# Patient Record
Sex: Female | Born: 1980 | Hispanic: No | Marital: Married | Smoking: Never smoker
Health system: Southern US, Community
[De-identification: ages and names within clinical notes are randomized; demographics above are authoritative.]

## PROBLEM LIST (undated history)

## (undated) ENCOUNTER — Emergency Department (HOSPITAL_COMMUNITY): Admission: EM | Payer: Self-pay | Source: Home / Self Care

## (undated) DIAGNOSIS — N809 Endometriosis, unspecified: Secondary | ICD-10-CM

## (undated) HISTORY — PX: CHOLECYSTECTOMY: SHX55

---

## 1999-10-31 ENCOUNTER — Emergency Department (HOSPITAL_COMMUNITY): Admission: EM | Admit: 1999-10-31 | Discharge: 1999-10-31 | Payer: Self-pay | Admitting: Emergency Medicine

## 1999-11-01 ENCOUNTER — Ambulatory Visit (HOSPITAL_COMMUNITY): Admission: RE | Admit: 1999-11-01 | Discharge: 1999-11-01 | Payer: Self-pay | Admitting: Emergency Medicine

## 1999-11-01 ENCOUNTER — Encounter: Payer: Self-pay | Admitting: Emergency Medicine

## 2016-01-05 ENCOUNTER — Emergency Department (HOSPITAL_COMMUNITY): Payer: Medicaid Other

## 2016-01-05 ENCOUNTER — Encounter (HOSPITAL_COMMUNITY): Payer: Self-pay | Admitting: *Deleted

## 2016-01-05 ENCOUNTER — Emergency Department (HOSPITAL_COMMUNITY)
Admission: EM | Admit: 2016-01-05 | Discharge: 2016-01-06 | Disposition: A | Discharge status: Other Acute Inpatient Hospital | Payer: Medicaid Other | Attending: Emergency Medicine | Admitting: Emergency Medicine

## 2016-01-05 DIAGNOSIS — R1010 Upper abdominal pain, unspecified: Secondary | ICD-10-CM | POA: Diagnosis present

## 2016-01-05 LAB — CBC
HCT: 40.2 % (ref 36.0–46.0)
Hemoglobin: 13.6 g/dL (ref 12.0–15.0)
MCH: 29.8 pg (ref 26.0–34.0)
MCHC: 33.8 g/dL (ref 30.0–36.0)
MCV: 88.2 fL (ref 78.0–100.0)
PLATELETS: 315 10*3/uL (ref 150–400)
RBC: 4.56 MIL/uL (ref 3.87–5.11)
RDW: 11.7 % (ref 11.5–15.5)
WBC: 13.6 10*3/uL — AB (ref 4.0–10.5)

## 2016-01-05 LAB — URINALYSIS, ROUTINE W REFLEX MICROSCOPIC
Glucose, UA: NEGATIVE mg/dL
NITRITE: NEGATIVE
PROTEIN: 30 mg/dL — AB
Specific Gravity, Urine: >1.046 — ABNORMAL HIGH (ref 1.005–1.030)
pH: 6 (ref 5.0–8.0)

## 2016-01-05 LAB — COMPREHENSIVE METABOLIC PANEL
ALT: 53 U/L (ref 14–54)
AST: 61 U/L — AB (ref 15–41)
Albumin: 3.8 g/dL (ref 3.5–5.0)
Alkaline Phosphatase: 90 U/L (ref 38–126)
Anion gap: 10 (ref 5–15)
BILIRUBIN TOTAL: 0.4 mg/dL (ref 0.3–1.2)
BUN: 5 mg/dL — AB (ref 6–20)
CO2: 23 mmol/L (ref 22–32)
CREATININE: 0.71 mg/dL (ref 0.44–1.00)
Calcium: 9 mg/dL (ref 8.9–10.3)
Chloride: 101 mmol/L (ref 101–111)
Glucose, Bld: 154 mg/dL — ABNORMAL HIGH (ref 65–99)
Potassium: 4 mmol/L (ref 3.5–5.1)
Sodium: 134 mmol/L — ABNORMAL LOW (ref 135–145)
TOTAL PROTEIN: 7.2 g/dL (ref 6.5–8.1)

## 2016-01-05 LAB — LIPASE, BLOOD: Lipase: 13 U/L (ref 11–51)

## 2016-01-05 LAB — URINE MICROSCOPIC-ADD ON

## 2016-01-05 LAB — PREGNANCY, URINE: Preg Test, Ur: NEGATIVE

## 2016-01-05 MED ORDER — HYDROMORPHONE HCL 1 MG/ML IJ SOLN
0.5000 mg | INTRAMUSCULAR | Status: DC | PRN
  Administered 2016-01-05 – 2016-01-06 (×2): 0.5 mg via INTRAVENOUS
  Filled 2016-01-05 (×3): qty 1

## 2016-01-05 MED ORDER — ONDANSETRON 4 MG PO TBDP
4.0000 mg | ORAL_TABLET | Freq: Once | ORAL | Status: AC | PRN
  Administered 2016-01-05: 4 mg via ORAL

## 2016-01-05 MED ORDER — ONDANSETRON HCL 4 MG/2ML IJ SOLN
4.0000 mg | Freq: Once | INTRAMUSCULAR | Status: AC
  Administered 2016-01-05: 4 mg via INTRAVENOUS
  Filled 2016-01-05: qty 2

## 2016-01-05 MED ORDER — ONDANSETRON 4 MG PO TBDP
ORAL_TABLET | ORAL | Status: DC
Start: 2016-01-05 — End: 2016-01-06
  Filled 2016-01-05: qty 1

## 2016-01-05 MED ORDER — OXYCODONE-ACETAMINOPHEN 5-325 MG PO TABS
2.0000 | ORAL_TABLET | Freq: Once | ORAL | Status: AC
  Administered 2016-01-05: 2 via ORAL
  Filled 2016-01-05: qty 2

## 2016-01-05 MED ORDER — SODIUM CHLORIDE 0.9 % IV BOLUS (SEPSIS)
1000.0000 mL | Freq: Once | INTRAVENOUS | Status: AC
Start: 2016-01-05 — End: 2016-01-05
  Administered 2016-01-05: 1000 mL via INTRAVENOUS

## 2016-01-05 MED ORDER — HYDROMORPHONE HCL 1 MG/ML IJ SOLN
1.0000 mg | Freq: Once | INTRAMUSCULAR | Status: AC
  Administered 2016-01-05: 1 mg via INTRAVENOUS
  Filled 2016-01-05: qty 1

## 2016-01-05 NOTE — ED Provider Notes (Signed)
CSN: [id number]     Arrival date & time 01/05/16  1845 History   First MD Initiated Contact with Patient 01/05/16 1915     Chief Complaint  Patient presents with  . Abdominal Pain    (Consider location/radiation/quality/duration/timing/severity/associated sxs/prior Treatment) HPI Comments: Patient with history of [facility]olecyst[facility] by Dr. Selena BattenKim. Operation was complicated by adhesions. This morning patient developed significant upper abdominal pain with some vomiting. Patient was [facility] where she had a normal white blood cell count and a CT chest, abdomen, and pelvis showing a small amount of fluid in the gallbladder fossa as well as a small amount of free fluid about the liver. Patient was treated for constipation with enema 2. She was discharged to home with MiraLAX and pain medication. Patient's symptoms continue to be severe so family brought her to the emergency department to be seen. Patient describes severe upper abdominal pain with radiation to the bilateral shoulders. No urinary symptoms or diarrhea. Patient denies any fevers. The onset of this condition was acute. The course is constant. Aggravating factors: none. Alleviating factors: none.    The history is provided by the patient.    History reviewed. No pertinent past medical history. Past Surgical History  Procedure Laterality Date  . Cholecystectomy     History reviewed. No pertinent family history. Social History  Substance Use Topics  . Smoking status: Never Smoker   . Smokeless tobacco: Never Used  . Alcohol Use: No   OB History    No data available     Review of Systems  Constitutional: Negative for fever.  HENT: Negative for rhinorrhea and sore throat.   Eyes: Negative for redness.  Respiratory: Negative for cough.   Cardiovascular: Negative for chest pain.  Gastrointestinal: Positive for nausea, vomiting and abdominal pain. Negative for diarrhea,  constipation and blood in stool.  Genitourinary: Negative for dysuria.  Musculoskeletal: Negative for myalgias.  Skin: Negative for rash.  Neurological: Negative for headaches.   Allergies  Review of patient's allergies indicates no known allergies.  Home Medications   Prior to Admission medications   Not on File   BP 122/74 mmHg  Pulse 90  Temp(Src) 98 F (36.7 C) (Oral)  Resp 16  SpO2 100%  LMP 12/28/2015 (Exact Date)   Physical Exam  Constitutional: She appears well-developed and well-nourished.  HENT:  Head: Normocephalic and atraumatic.  Eyes: Conjunctivae are normal. Right eye exhibits no discharge. Left eye exhibits no discharge.  Neck: Normal range of motion. Neck supple.  Cardiovascular: Normal rate, regular rhythm and normal heart sounds.   Pulmonary/Chest: Effort normal and breath sounds normal.  Abdominal: Soft. Bowel sounds are decreased. There is tenderness. There is guarding. There is no rebound.  Surgical wounds appear to be well-healing, no drainage.   Neurological: She is alert.  Skin: Skin is warm and dry.  Psychiatric: She has a normal mood and affect.  Nursing note and vitals reviewed.   ED Course  Procedures (including critical care time) Labs Review Labs Reviewed  COMPREHENSIVE METABOLIC PANEL - Abnormal; Notable for the following:    Sodium 134 (*)    Glucose, Bld 154 (*)    BUN 5 (*)    AST 61 (*)    All other components within normal limits  CBC - Abnormal; Notable for the following:    WBC 13.6 (*)    All other components within normal limits  URINALYSIS, ROUTINE W REFLEX MICROSCOPIC (NOT AT  [facility]) - Abnormal; Notable for the following:    Color, Urine AMBER (*)    APPearance HAZY (*)    Specific Gravity, Urine >1.046 (*)    Hgb urine dipstick MODERATE (*)    Bilirubin Urine SMALL (*)    Ketones, ur >80 (*)    Protein, ur 30 (*)    Leukocytes, UA TRACE (*)    All other components within normal limits  URINE MICROSCOPIC-ADD ON -  Abnormal; Notable for the following:    Squamous Epithelial / LPF 0-5 (*)    Bacteria, UA FEW (*)    All other components within normal limits  URINE CULTURE  LIPASE, BLOOD  PREGNANCY, URINE    Imaging Review Dg Abd Acute W/chest  01/05/2016  CLINICAL DATA:  Abdominal pain, nausea and vomiting beginning yesterday. Constipation. Status post cholecystectomy December 31, 2015. Shortness of breath. EXAM: DG ABDOMEN ACUTE W/ 1V CHEST COMPARISON:  None. FINDINGS: Cardiomediastinal silhouette is normal. Lungs are clear, no pleural effusions. Mild bronchitic changes. No pneumothorax. Soft tissue planes and included osseous structures are normal. Bilateral breast implants. Bowel gas pattern is nondilated and nonobstructive. Surgical clips in the included right abdomen compatible with cholecystectomy. Moderate amount of retained large bowel stool. Contrast in the urinary bladder. No intra-abdominal mass effect, pathologic calcifications or free air. Soft tissue planes and included osseous structures are non-suspicious. IMPRESSION: Mild bronchitic changes. Moderate amount of retained large bowel stool, nonobstructive bowel gas pattern. Status post cholecystectomy. Electronically Signed   By: Awilda Metro M.D.   On: 01/05/2016 23:11   I have personally reviewed and evaluated these images and lab results as part of my medical decision-making.   8:16 PM Patient seen and examined. Work-up initiated. Medications ordered. Patient discussed with and seen by Dr. Estell Harpin.   Vital signs reviewed and are as follows: BP 122/74 mmHg  Pulse 90  Temp(Src) 98 F (36.7 C) (Oral)  Resp 16  SpO2 100%  LMP 12/28/2015 (Exact Date)  9:30 PM Spoke with Dr. Samuella Cota surgeon who works with Dr. Selena Batten at [facility]. She accepts patient and requests be transferred to [facility]. Patient will have to be sent to ED and be seen there. Dr. Samuella Cota states they will let ED know to expect patient.    11:03 PM Abd films complete. No free air. Will  proceed with transfer.   11:59 PM Spoke with Dr. Norlene Campbell at [facility] ED who is aware patient to be transferred to ED.     MDM   Final diagnoses:  Pain of upper abdomen   Transfer to [facility] for eval bile leak, post-surgical complication.     Renne Crigler, PA-C 01/06/16 0001  Bethann Berkshire, MD 01/07/16 [phone]

## 2016-01-05 NOTE — ED Notes (Addendum)
Pt c/o abdominal pain this morning, last BM today. Pt had enema today. Pt had gallbladder removed last Friday. Pt vomited x 2 today.

## 2016-01-06 DIAGNOSIS — R1010 Upper abdominal pain, unspecified: Secondary | ICD-10-CM | POA: Diagnosis present

## 2016-01-06 LAB — URINE CULTURE: SPECIAL REQUESTS: NORMAL

## 2016-01-06 MED ORDER — ONDANSETRON HCL 4 MG/2ML IJ SOLN
4.0000 mg | Freq: Once | INTRAMUSCULAR | Status: AC
  Administered 2016-01-06: 4 mg via INTRAVENOUS
  Filled 2016-01-06: qty 2

## 2016-01-06 MED ORDER — HYDROMORPHONE HCL 1 MG/ML IJ SOLN
0.5000 mg | Freq: Once | INTRAMUSCULAR | Status: AC
  Administered 2016-01-06: 0.5 mg via INTRAVENOUS

## 2016-01-07 MED FILL — Hydromorphone HCl Inj 2 MG/ML: INTRAMUSCULAR | Qty: 1 | Status: AC

## 2016-12-14 IMAGING — CR DG ABDOMEN ACUTE W/ 1V CHEST
3 series · 3 of 3 positions shown · non-contrast
Comparison: None.

CLINICAL DATA: Abdominal pain, nausea and vomiting beginning
yesterday. Constipation. Status post cholecystectomy December 31, 2015.
Shortness of breath.

EXAM:
DG ABDOMEN ACUTE W/ 1V CHEST

[chest pa]
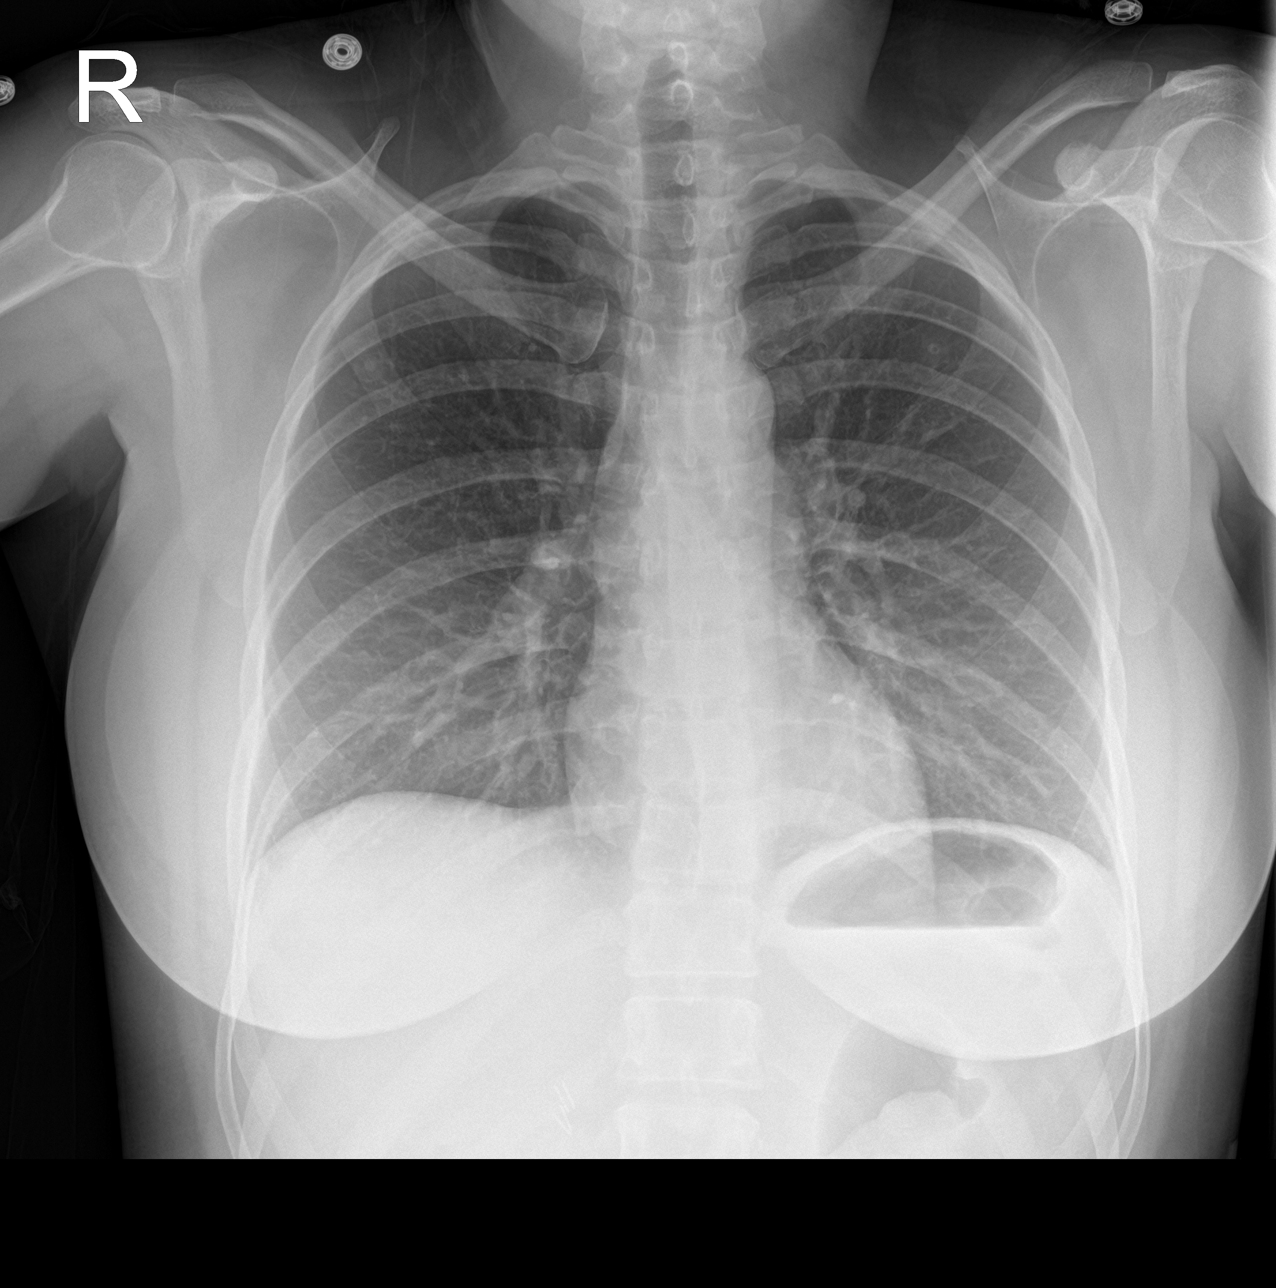

[abdomen erect]
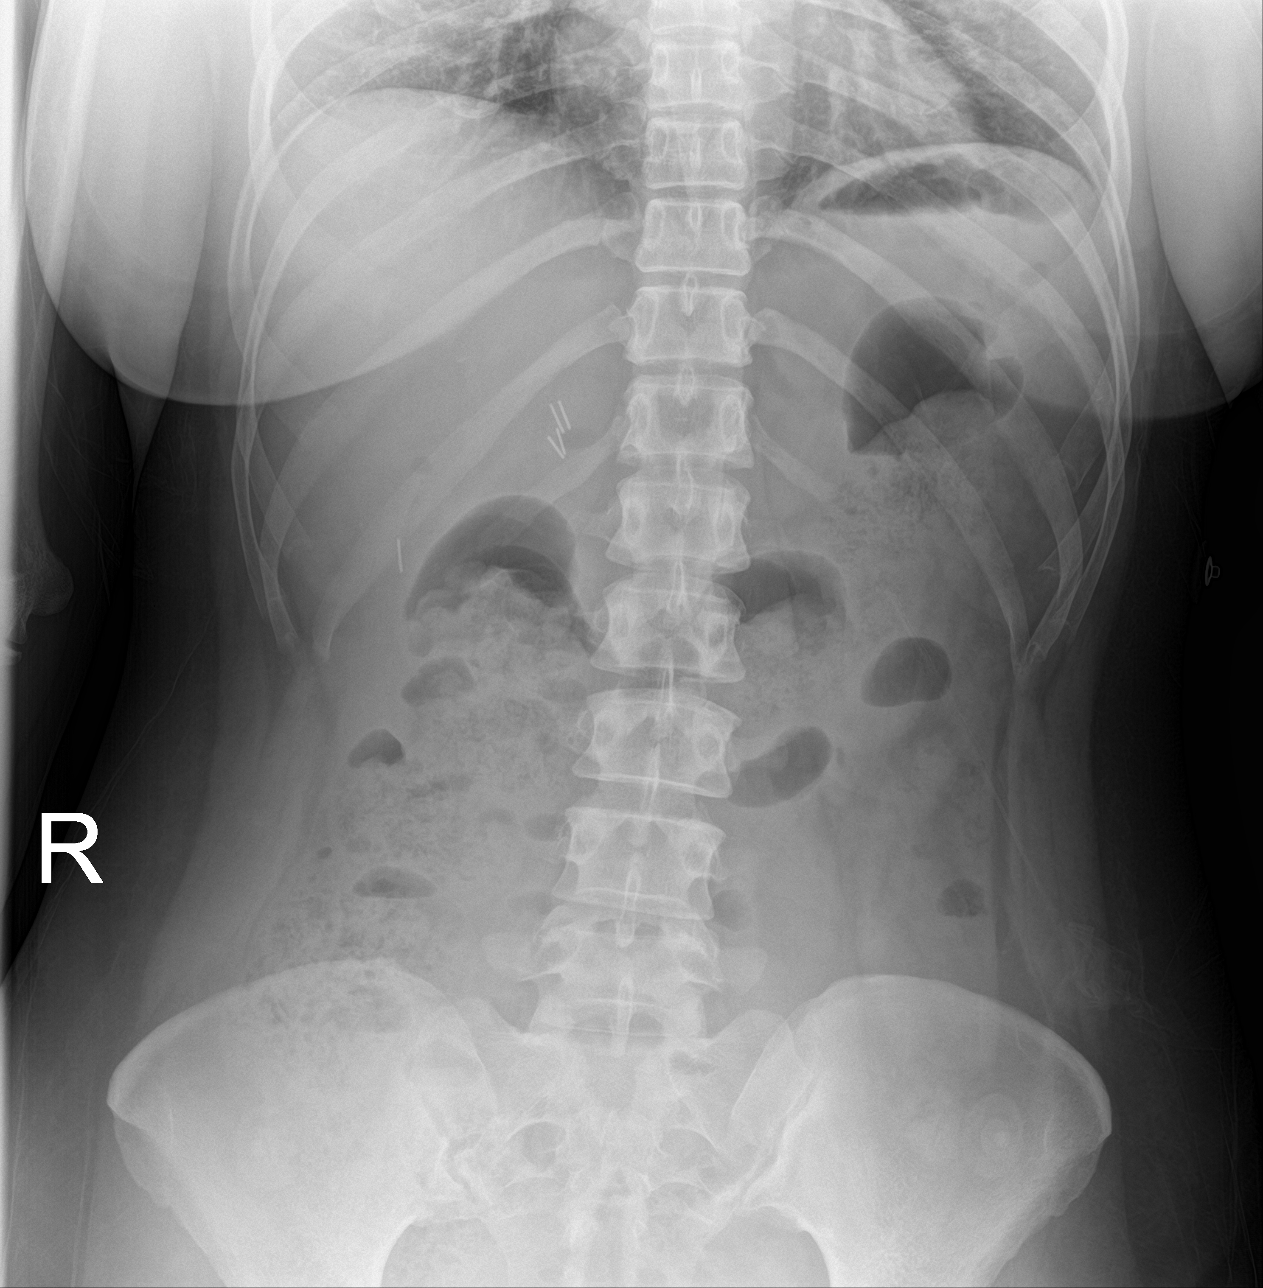

[abdomen supine]
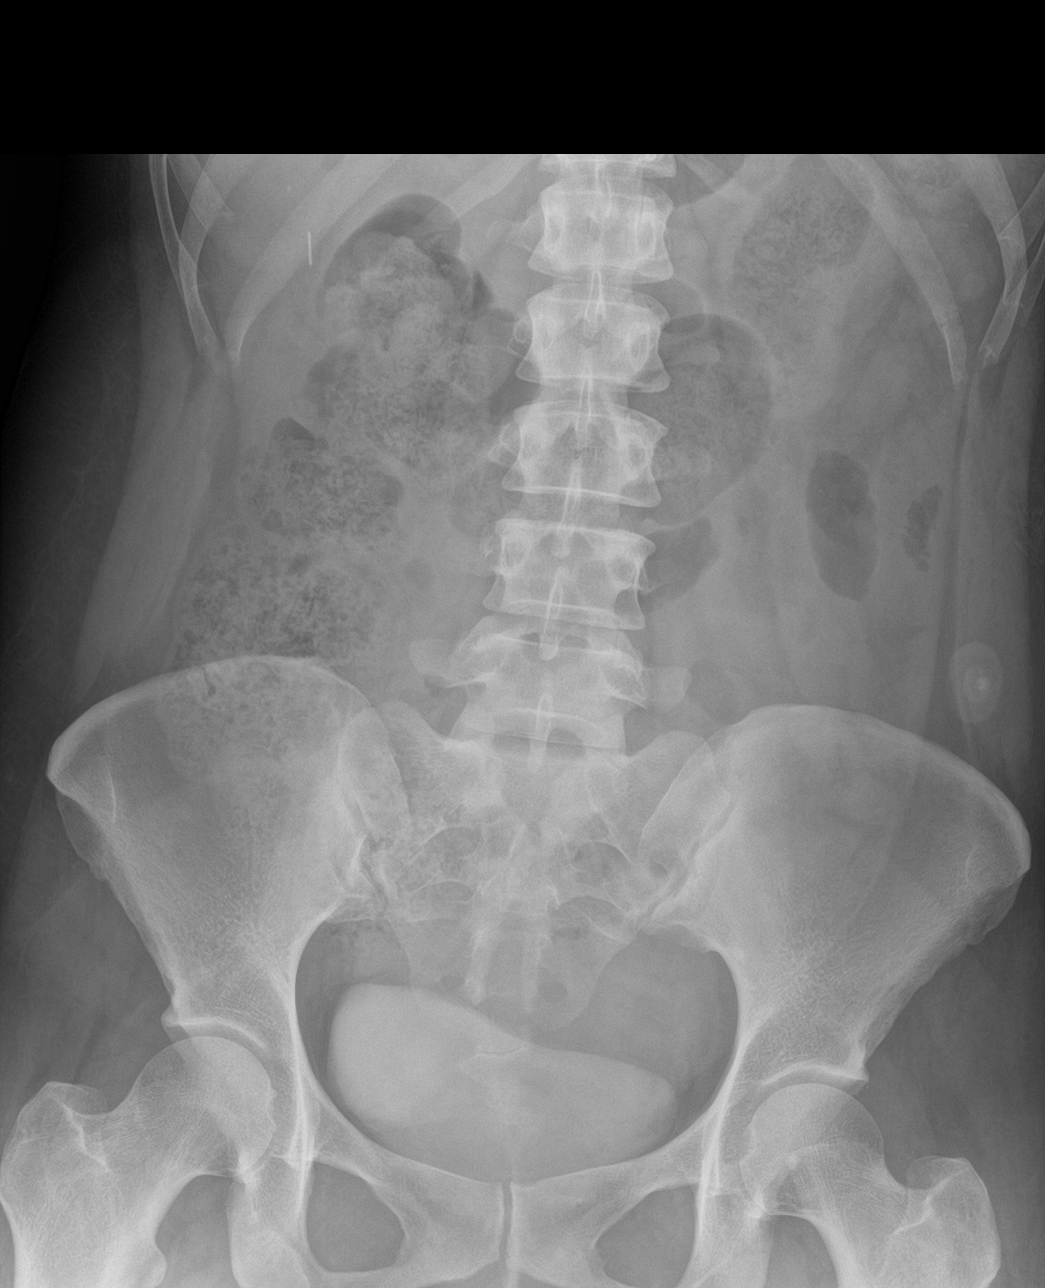

[3 of 3 positions shown; findings below may reference images not displayed]

FINDINGS: Cardiomediastinal silhouette is normal. Lungs are clear, no pleural
effusions. Mild bronchitic changes. No pneumothorax. Soft tissue
planes and included osseous structures are normal. Bilateral breast
implants.

Bowel gas pattern is nondilated and nonobstructive. Surgical clips
in the included right abdomen compatible with cholecystectomy.
Moderate amount of retained large bowel stool. Contrast in the
urinary bladder. No intra-abdominal mass effect, pathologic
calcifications or free air. Soft tissue planes and included osseous
structures are non-suspicious.
IMPRESSION: Mild bronchitic changes.

Moderate amount of retained large bowel stool, nonobstructive bowel
gas pattern. Status post cholecystectomy.

## 2018-08-12 ENCOUNTER — Other Ambulatory Visit: Payer: Self-pay | Admitting: *Deleted

## 2018-08-12 DIAGNOSIS — Z124 Encounter for screening for malignant neoplasm of cervix: Secondary | ICD-10-CM

## 2018-08-13 NOTE — Progress Notes (Signed)
Patient: Sherry James           Date of Birth: 05-26-1981           MRN: [id number] Visit Date: 08/12/2018 PCP: Patient, No Pcp Per  Cervical Cancer Screening Do you smoke?: No Have you ever had or been told you have an allergy to latex products?: No Marital status: Married Date of last pap smear: 2-5 yrs ago Date of last menstrual period: 08/02/18 Number of pregnancies: 2 Number of births: 2 Have you ever had any of the following? Hysterectomy: No Tubal ligation (tubes tied): No Abnormal bleeding: Yes Abnormal pap smear: No Venereal warts: No A sex partner with venereal warts: No A high risk* sex partner: No  Cervical Exam Exam not completed.  Patient's History There are no active problems to display for this patient.  No past medical history on file.  No family history on file.  Social History   Occupational History  . Not on file  Tobacco Use  . Smoking status: Never Smoker  . Smokeless tobacco: Never Used  Substance and Sexual Activity  . Alcohol use: No  . Drug use: No  . Sexual activity: Not on file   Patient examined by Terie Purser, PA-C. See screening notes scanned into Epic.

## 2018-08-15 LAB — CYTOLOGY - PAP: Diagnosis: NEGATIVE

## 2018-09-17 ENCOUNTER — Telehealth (HOSPITAL_COMMUNITY): Payer: Self-pay | Admitting: *Deleted

## 2018-09-17 NOTE — Telephone Encounter (Signed)
Called patient with Spanish interpreter Sherry James from [facility] to discuss her cervical cancer screening results. Explained to patient that her Pap smear was normal. Patient complained of of RLQ and a referral to the [facility] was recommended. Patient stated the pain has resolved. Patient advised to call if has the pain again. Patient verbalized understanding.

## 2023-04-19 ENCOUNTER — Other Ambulatory Visit: Payer: Self-pay

## 2023-04-19 ENCOUNTER — Encounter (HOSPITAL_COMMUNITY): Payer: Self-pay

## 2023-04-19 ENCOUNTER — Emergency Department (HOSPITAL_COMMUNITY): Payer: Self-pay

## 2023-04-19 ENCOUNTER — Emergency Department (HOSPITAL_COMMUNITY)
Admission: EM | Admit: 2023-04-19 | Discharge: 2023-04-19 | Disposition: A | Discharge status: Home / Self Care | Payer: Self-pay | Attending: Emergency Medicine | Admitting: Emergency Medicine

## 2023-04-19 DIAGNOSIS — R102 Pelvic and perineal pain: Secondary | ICD-10-CM

## 2023-04-19 DIAGNOSIS — N946 Dysmenorrhea, unspecified: Secondary | ICD-10-CM | POA: Insufficient documentation

## 2023-04-19 HISTORY — DX: Endometriosis, unspecified: N80.9

## 2023-04-19 LAB — CBC WITH DIFFERENTIAL/PLATELET
Abs Immature Granulocytes: 0.01 10*3/uL (ref 0.00–0.07)
Basophils Absolute: 0.1 10*3/uL (ref 0.0–0.1)
Basophils Relative: 1 %
Eosinophils Absolute: 0 10*3/uL (ref 0.0–0.5)
Eosinophils Relative: 1 %
HCT: 37.5 % (ref 36.0–46.0)
Hemoglobin: 12.6 g/dL (ref 12.0–15.0)
Immature Granulocytes: 0 %
Lymphocytes Relative: 24 %
Lymphs Abs: 1.8 10*3/uL (ref 0.7–4.0)
MCH: 29.9 pg (ref 26.0–34.0)
MCHC: 33.6 g/dL (ref 30.0–36.0)
MCV: 88.9 fL (ref 80.0–100.0)
Monocytes Absolute: 0.3 10*3/uL (ref 0.1–1.0)
Monocytes Relative: 4 %
Neutro Abs: 5.3 10*3/uL (ref 1.7–7.7)
Neutrophils Relative %: 70 %
Platelets: 326 10*3/uL (ref 150–400)
RBC: 4.22 MIL/uL (ref 3.87–5.11)
RDW: 12.2 % (ref 11.5–15.5)
WBC: 7.5 10*3/uL (ref 4.0–10.5)
nRBC: 0 % (ref 0.0–0.2)

## 2023-04-19 LAB — COMPREHENSIVE METABOLIC PANEL
ALT: 17 U/L (ref 0–44)
AST: 19 U/L (ref 15–41)
Albumin: 3.6 g/dL (ref 3.5–5.0)
Alkaline Phosphatase: 58 U/L (ref 38–126)
Anion gap: 7 (ref 5–15)
BUN: 6 mg/dL (ref 6–20)
CO2: 25 mmol/L (ref 22–32)
Calcium: 8.8 mg/dL — ABNORMAL LOW (ref 8.9–10.3)
Chloride: 105 mmol/L (ref 98–111)
Creatinine, Ser: 0.76 mg/dL (ref 0.44–1.00)
GFR, Estimated: >60 mL/min (ref >60)
Glucose, Bld: 95 mg/dL (ref 70–99)
Potassium: 3.9 mmol/L (ref 3.5–5.1)
Sodium: 137 mmol/L (ref 135–145)
Total Bilirubin: 0.3 mg/dL (ref 0.3–1.2)
Total Protein: 6.9 g/dL (ref 6.5–8.1)

## 2023-04-19 LAB — WET PREP, GENITAL
Clue Cells Wet Prep HPF POC: NONE SEEN
Sperm: NONE SEEN
Trich, Wet Prep: NONE SEEN
WBC, Wet Prep HPF POC: <10 (ref <10)
Yeast Wet Prep HPF POC: NONE SEEN

## 2023-04-19 LAB — URINALYSIS, ROUTINE W REFLEX MICROSCOPIC
Bacteria, UA: NONE SEEN
Bilirubin Urine: NEGATIVE
Glucose, UA: NEGATIVE mg/dL
Ketones, ur: NEGATIVE mg/dL
Leukocytes,Ua: NEGATIVE
Nitrite: NEGATIVE
Protein, ur: NEGATIVE mg/dL
Specific Gravity, Urine: 1.002 — ABNORMAL LOW (ref 1.005–1.030)
pH: 6 (ref 5.0–8.0)

## 2023-04-19 LAB — PREGNANCY, URINE: Preg Test, Ur: NEGATIVE

## 2023-04-19 MED ORDER — IBUPROFEN 600 MG PO TABS: 600.0000 mg | ORAL_TABLET | Freq: Four times a day (QID) | ORAL | 0 refills | Status: AC | PRN

## 2023-04-19 MED ORDER — KETOROLAC TROMETHAMINE 15 MG/ML IJ SOLN: 15.0000 mg | Freq: Once | INTRAMUSCULAR | Status: DC

## 2023-04-19 MED ORDER — KETOROLAC TROMETHAMINE 15 MG/ML IJ SOLN
15.0000 mg | Freq: Once | INTRAMUSCULAR | Status: AC
  Administered 2023-04-19: 15 mg via INTRAMUSCULAR
  Filled 2023-04-19: qty 1

## 2023-04-19 NOTE — Discharge Instructions (Signed)
PLEASE SEE YOUR GYNECOLOGIST AS SOON AS POSSIBLE TO DISCUSS OTHER TREATMENT OPTIONS FOR YOUR PAIN.

## 2023-04-19 NOTE — ED Provider Notes (Signed)
[place] EMERGENCY DEPARTMENT AT [facility] Provider Note   CSN: [id number] Arrival date & time: 04/19/23  1402     History  Chief Complaint  Patient presents with   Pelvic Pain    Sherry James is a 42 y.o. female G2P2 who presents to the emergency department for lower pelvic pain with menstrual cycle.  Patient has had the symptoms for 6 months.  Pain was initially only present during menses but for the past few cycles she has had mild pain in between as well.  She was seen 01/03/2023 at [facility] ED for similar symptoms.  She was sent with a GYN referral.  She followed up with them and was put on an oral contraceptive which she has been taking but does not feel that it has been helping.  Today she is presenting with the same pain though it is worse in quality, and also is now sharp and stabbing on the right side.  Associated with nausea but no vomiting.  LMP started 15 days ago after she missed an active pill of her OCPs.  She is now in the week that was supposed to be her period.  Denies heavy bleeding or passage of clots.  No fevers.  Does endorse some discharge.      Home Medications Prior to Admission medications   Medication Sig Start Date End Date Taking? Authorizing Provider  ibuprofen (ADVIL) 600 MG tablet Take 1 tablet (600 mg total) by mouth every 6 (six) hours as needed. 04/19/23  Yes Karmen Stabs, MD      Allergies    Patient has no known allergies.    Review of Systems   Review of Systems as per HPI  Physical Exam Updated Vital Signs BP 125/74   Pulse 78   Temp 98.7 F (37.1 C)   Resp 16   Ht 5\' 3"  (1.6 m)   Wt 67.6 kg   SpO2 98%   BMI 26.40 kg/m  Physical Exam Constitutional:      Appearance: Normal appearance.     Comments: Appears uncomfortable due to pain  HENT:     Head: Normocephalic and atraumatic.     Nose: Nose normal.     Mouth/Throat:     Mouth: Mucous membranes are moist.  Eyes:     Extraocular Movements: Extraocular  movements intact.     Pupils: Pupils are equal, round, and reactive to light.  Cardiovascular:     Rate and Rhythm: Normal rate and regular rhythm.     Pulses: Normal pulses.     Heart sounds: Normal heart sounds.  Pulmonary:     Effort: Pulmonary effort is normal.     Breath sounds: Normal breath sounds.  Abdominal:     General: There is no distension.     Palpations: Abdomen is soft.     Tenderness: There is abdominal tenderness (suprapubic, bilateral lower quadrant). There is no guarding or rebound.  Genitourinary:    General: Normal vulva.     Vagina: No vaginal discharge.     Comments: Normal-appearing cervix with no signs of inflammation.  No vaginal discharge.  Slow dark bleeding from cervix consistent with menses.  No vaginal laceration, inflammation, or foreign body.  No cervical motion or adnexal tenderness. Musculoskeletal:     Cervical back: Neck supple.     Right lower leg: No edema.     Left lower leg: No edema.  Skin:    General: Skin is warm.  Capillary Refill: Capillary refill takes less than 2 seconds.  Neurological:     General: No focal deficit present.     Mental Status: She is alert. Mental status is at baseline.     Sensory: No sensory deficit.     Motor: No weakness.     ED Results / Procedures / Treatments   Labs (all labs ordered are listed, but only abnormal results are displayed) Labs Reviewed  COMPREHENSIVE METABOLIC PANEL - Abnormal; Notable for the following components:      Result Value   Calcium 8.8 (*)    All other components within normal limits  URINALYSIS, ROUTINE W REFLEX MICROSCOPIC - Abnormal; Notable for the following components:   Color, Urine COLORLESS (*)    Specific Gravity, Urine 1.002 (*)    Hgb urine dipstick MODERATE (*)    All other components within normal limits  WET PREP, GENITAL  CBC WITH DIFFERENTIAL/PLATELET  PREGNANCY, URINE  GC/CHLAMYDIA PROBE AMP ([place]) NOT AT [facility]    EKG None  Radiology US  PELVIC COMPLETE W TRANSVAGINAL AND TORSION R/O  Result Date: 04/19/2023 CLINICAL DATA:  Menstrual pain. EXAM: TRANSABDOMINAL AND TRANSVAGINAL ULTRASOUND OF PELVIS DOPPLER ULTRASOUND OF OVARIES TECHNIQUE: Both transabdominal and transvaginal ultrasound examinations of the pelvis were performed. Transabdominal technique was performed for global imaging of the pelvis including uterus, ovaries, adnexal regions, and pelvic cul-de-sac. It was necessary to proceed with endovaginal exam following the transabdominal exam to visualize the uterus, endometrium, bilateral ovaries and bilateral adnexa. Color and duplex Doppler ultrasound was utilized to evaluate blood flow to the ovaries. COMPARISON:  None Available. FINDINGS: Uterus Measurements: 10.2 cm x 5.2 cm x 6.2 cm = volume: 173.57 mL. The uterine parenchyma is heterogeneous in appearance. No fibroids or other mass visualized. Endometrium Thickness: 6.3 mm.  No focal abnormality visualized. Right ovary Measurements: 3.0 cm x 1.3 cm x 2.2 cm = volume: 4.41 mL. Normal appearance/no adnexal mass. Left ovary Measurements: 2.6 cm x 1.4 cm x 2.4 cm = volume: 4.53 mL. Normal appearance/no adnexal mass. Pulsed Doppler evaluation of both ovaries demonstrates normal low-resistance arterial and venous waveforms. Other findings There is a trace amount of pelvic free fluid. IMPRESSION: 1. Heterogeneous appearance of the uterine parenchyma. 2. Trace amount of pelvic free fluid, likely physiologic. Electronically Signed   By: Aram Candela M.D.   On: 04/19/2023 19:55    Procedures Procedures    Medications Ordered in ED Medications  ketorolac (TORADOL) 15 MG/ML injection 15 mg (15 mg Intramuscular Given 04/19/23 1819)    ED Course/ Medical Decision Making/ A&P                                 Medical Decision Making Amount and/or Complexity of Data Reviewed External Data Reviewed: notes. Labs: ordered. Decision-making details documented in ED Course. Radiology:  ordered and independent interpretation performed. Decision-making details documented in ED Course.  Risk Prescription drug management.   42 year old female presenting with acute on chronic lower abdominal pain worse during menses.  Has previously been evaluated in the emergency department in the outpatient setting with impression being dysmenorrhea versus endometriosis.  Differential considered includes ongoing dysmenorrhea, endometriosis, fibroid; other items considered include torsion given the presence of stabbing right sided pain and nausea, also considering pregnancy related pathology such as SAB or other abortion spectrum; considering abdominal surgical process such as appendicitis diverticulitis, considering UTI, considering PID or TOA presence of discharge.  On exam she is hemodynamically stable and afebrile.  She appears uncomfortable but nontoxic.  Her abdomen is tender in the lower quadrants and suprapubic region but is not peritonitic, not consistent with an abdominal surgical emergency; will not obtain CT at this time.    Chaperoned pelvic exam was performed and findings are normal as documented above.  Swabs for gonorrhea, chlamydia, wet prep were sent. Wet prep resulted negative. GC in process for outpatient f/u.  Given absence of CMT or adnexal tenderness as well as absence of fevers, suspicion for PID or TOA is exceedingly low.  Empiric treatment is not indicated.  Labs as well as a transvaginal ultrasound to investigate for structural cause of bleeding or ovarian torsion were obtained and are notable for normal CBC, CMP values, negative pregnancy, unremarkable UA without signs of UTI (contamination likely 2/2 menses), ultrasound shows no evidence of torsion, no fibroids,  no ovarian cysts, no adnexal masses; no acute findings.  Toradol given for pain. Prescription for ibuprofen sent to preferred pharmacy. I counselled the patient on her workup so far; suspect endometriosis vs  dysmenorrhea; she is agreeable to f/u with GYN in the outpatient setting. Strict return precautions discussed. Discharged in stable condition.            Final Clinical Impression(s) / ED Diagnoses Final diagnoses:  Pelvic pain in female  Dysmenorrhea    Rx / DC Orders ED Discharge Orders          Ordered    ibuprofen (ADVIL) 600 MG tablet  Every 6 hours PRN        04/19/23 2009              Karmen Stabs, MD 04/19/23 2340    Jacalyn Lefevre, MD 04/20/23 1706

## 2023-04-19 NOTE — ED Triage Notes (Signed)
Pt c/o "a lot of pain during menstrual." Pt states she had this same thing happen 3 mos ago and was given pain meds, but states the pain is worse now and the pain meds are no longer working

## 2023-04-19 NOTE — ED Notes (Signed)
I  used interpreter to triage pt

## 2023-04-20 LAB — GC/CHLAMYDIA PROBE AMP (~~LOC~~) NOT AT ARMC
Chlamydia: NEGATIVE
Comment: NEGATIVE
Comment: NORMAL
Neisseria Gonorrhea: NEGATIVE
